# Patient Record
Sex: Male | Born: 1985 | Race: White | Hispanic: No | Marital: Married | Smoking: Never smoker
Health system: Southern US, Community
[De-identification: ages and names within clinical notes are randomized; demographics above are authoritative.]

## PROBLEM LIST (undated history)

## (undated) HISTORY — PX: NO PAST SURGERIES: SHX2092

---

## 2012-07-23 ENCOUNTER — Encounter (HOSPITAL_COMMUNITY): Payer: Self-pay | Admitting: *Deleted

## 2012-07-23 ENCOUNTER — Emergency Department (HOSPITAL_COMMUNITY)
Admission: EM | Admit: 2012-07-23 | Discharge: 2012-07-23 | Disposition: A | Discharge status: Home / Self Care | Payer: BC Managed Care – PPO | Attending: Emergency Medicine | Admitting: Emergency Medicine

## 2012-07-23 ENCOUNTER — Emergency Department (HOSPITAL_COMMUNITY): Payer: BC Managed Care – PPO

## 2012-07-23 DIAGNOSIS — S52123A Displaced fracture of head of unspecified radius, initial encounter for closed fracture: Secondary | ICD-10-CM | POA: Insufficient documentation

## 2012-07-23 MED ORDER — OXYCODONE-ACETAMINOPHEN 5-325 MG PO TABS: 2.0000 | ORAL_TABLET | ORAL | Status: AC | PRN

## 2012-07-23 MED ORDER — TETANUS-DIPHTH-ACELL PERTUSSIS 5-2.5-18.5 LF-MCG/0.5 IM SUSP
0.5000 mL | Freq: Once | INTRAMUSCULAR | Status: AC
  Administered 2012-07-23: 0.5 mL via INTRAMUSCULAR
  Filled 2012-07-23: qty 0.5

## 2012-07-23 MED ORDER — OXYCODONE-ACETAMINOPHEN 5-325 MG PO TABS
2.0000 | ORAL_TABLET | Freq: Once | ORAL | Status: AC
  Administered 2012-07-23: 2 via ORAL
  Filled 2012-07-23: qty 2
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
## 2012-07-23 NOTE — Progress Notes (Signed)
Orthopedic Tech Progress Note Patient Details:  William Duncan December 06, 1986 [id number]  Ortho Devices Type of Ortho Device: Arm foam sling;Long arm splint Ortho Device/Splint Location: (L) UE Ortho Device/Splint Interventions: Application   Jennye Moccasin 07/23/2012, 3:32 PM
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
## 2018-12-26 DIAGNOSIS — J209 Acute bronchitis, unspecified: Secondary | ICD-10-CM | POA: Diagnosis not present

## 2019-01-02 ENCOUNTER — Ambulatory Visit (INDEPENDENT_AMBULATORY_CARE_PROVIDER_SITE_OTHER): Payer: 59 | Admitting: Family Medicine

## 2019-01-02 ENCOUNTER — Encounter: Payer: Self-pay | Admitting: Family Medicine

## 2019-01-02 VITALS — BP 106/70 | HR 67 | Temp 97.4°F | Ht 64.5 in | Wt 177.8 lb

## 2019-01-02 DIAGNOSIS — Z0000 Encounter for general adult medical examination without abnormal findings: Secondary | ICD-10-CM | POA: Diagnosis not present

## 2019-01-02 NOTE — Patient Instructions (Addendum)
Bring your lab results from the [facility] physical   Preventive Care 18-39 Years, Male Preventive care refers to lifestyle choices and visits with your health care provider that can promote health and wellness. What does preventive care include?   A yearly physical exam. This is also called an annual well check.  Dental exams once or twice a year.  Routine eye exams. Ask your health care provider how often you should have your eyes checked.  Personal lifestyle choices, including: ? Daily care of your teeth and gums. ? Regular physical activity. ? Eating a healthy diet. ? Avoiding tobacco and drug use. ? Limiting alcohol use. ? Practicing safe sex. What happens during an annual well check? The services and screenings done by your health care provider during your annual well check will depend on your age, overall health, lifestyle risk factors, and family history of disease. Counseling Your health care provider may ask you questions about your:  Alcohol use.  Tobacco use.  Drug use.  Emotional well-being.  Home and relationship well-being.  Sexual activity.  Eating habits.  Work and work Statistician. Screening You may have the following tests or measurements:  Height, weight, and BMI.  Blood pressure.  Lipid and cholesterol levels. These may be checked every 5 years starting at age 2.  Diabetes screening. This is done by checking your blood sugar (glucose) after you have not eaten for a while (fasting).  Skin check.  Hepatitis C blood test.  Hepatitis B blood test.  Sexually transmitted disease (STD) testing. Discuss your test results, treatment options, and if necessary, the need for more tests with your health care provider. Vaccines Your health care provider may recommend certain vaccines, such as:  Influenza vaccine. This is recommended every year.  Tetanus, diphtheria, and acellular pertussis (Tdap, Td) vaccine. You may need a Td booster every  10 years.  Varicella vaccine. You may need this if you have not been vaccinated.  HPV vaccine. If you are 57 or younger, you may need three doses over 6 months.  Measles, mumps, and rubella (MMR) vaccine. You may need at least one dose of MMR.You may also need a second dose.  Pneumococcal 13-valent conjugate (PCV13) vaccine. You may need this if you have certain conditions and have not been vaccinated.  Pneumococcal polysaccharide (PPSV23) vaccine. You may need one or two doses if you smoke cigarettes or if you have certain conditions.  Meningococcal vaccine. One dose is recommended if you are age 52-21 years and a first-year college student living in a residence hall, or if you have one of several medical conditions. You may also need additional booster doses.  Hepatitis A vaccine. You may need this if you have certain conditions or if you travel or work in places where you may be exposed to hepatitis A.  Hepatitis B vaccine. You may need this if you have certain conditions or if you travel or work in places where you may be exposed to hepatitis B.  Haemophilus influenzae type b (Hib) vaccine. You may need this if you have certain risk factors. Talk to your health care provider about which screenings and vaccines you need and how often you need them. This information is not intended to replace advice given to you by your health care provider. Make sure you discuss any questions you have with your health care provider. Document Released: 01/31/2002 Document Revised: 07/18/2017 Document Reviewed: 10/06/2015 Elsevier Interactive Patient Education  2019 [employer].
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
## 2019-06-20 ENCOUNTER — Encounter: Payer: Self-pay | Admitting: Family Medicine

## 2019-06-20 ENCOUNTER — Ambulatory Visit (INDEPENDENT_AMBULATORY_CARE_PROVIDER_SITE_OTHER): Payer: 59 | Admitting: Family Medicine

## 2019-06-20 VITALS — HR 60 | Wt 175.0 lb

## 2019-06-20 DIAGNOSIS — K219 Gastro-esophageal reflux disease without esophagitis: Secondary | ICD-10-CM | POA: Diagnosis not present

## 2019-06-20 HISTORY — DX: Gastro-esophageal reflux disease without esophagitis: K21.9

## 2019-06-20 MED ORDER — PANTOPRAZOLE SODIUM 20 MG PO TBEC: 20.0000 mg | DELAYED_RELEASE_TABLET | Freq: Every day | ORAL | 1 refills | Status: DC

## 2019-06-20 NOTE — Patient Instructions (Signed)

## 2019-06-20 NOTE — Assessment & Plan Note (Signed)
Given symptoms most likely suspect GERD. Trial of PPI and will switch as questionable improvement on prilosec. Also discussed diet/lifestyle changes. If no improvement would do H pylori testing and consider GI referral if negative.

## 2019-06-20 NOTE — Progress Notes (Signed)
I connected with William Duncan on 06/20/19 at  2:00 PM EDT by video and verified that I am speaking with the correct person using two identifiers.   I discussed the limitations, risks, security and privacy concerns of performing an evaluation and management service by video and the availability of in person appointments. I also discussed with the patient that there may be a patient responsible charge related to this service. The patient expressed understanding and agreed to proceed.  Patient location: Work Provider Location: [facility] Participants: Lynnda ChildJessica R Kristinia Leavy and William Duncan   Subjective:     William Duncan is a 33 y.o. male presenting for Trouble eating (he is not able to eat as much as he is used-eating about a quarter of what he is use to. Has a gag like sensation, feels like he needs to vomit a lot. Has taking Tums. This started around November 2019.)     HPI   #Trouble eating - has been going on since November - will get a gag reflex  - was sick from November-December > respiratory symptoms followed by nausea - since then has not been able to eat as much as normal - no significant weight loss - certain foods make it worse - runs/work-out regularly - will eat most of his meal, but towards the end will start to get a gag reflex sensation - no abdominal pain - triggers: coffee - no vomiting - no difficulty swallowing or sensation of something stuck in the throat  - was primarily breakfast, but now progressing - grease in sausage seemed to trigger  -- Breakfast: Oatmeal (1 bag of instant) -- Lunch: frozen lasagna, leftovers  -- Dinner: chicken and pasta, steak, frozen   Endorses reflux symptoms at night  Treatment: tums, omeprazole intermittently, but trying to take daily for 1.5 weeks   Review of Systems See HPI  Social History   Tobacco Use  Smoking Status Never Smoker  Smokeless Tobacco Never Used        Objective:   BP Readings from  Last 3 Encounters:  01/02/19 106/70  07/23/12 142/68   Wt Readings from Last 3 Encounters:  06/20/19 175 lb (79.4 kg)  01/02/19 177 lb 12 oz (80.6 kg)    Pulse 60 Comment: per patient  Wt 175 lb (79.4 kg) Comment: per patient  BMI 29.57 kg/m    Physical Exam Constitutional:      Appearance: Normal appearance. He is not ill-appearing.  HENT:     Head: Normocephalic and atraumatic.     Right Ear: External ear normal.     Left Ear: External ear normal.  Eyes:     Conjunctiva/sclera: Conjunctivae normal.  Pulmonary:     Effort: Pulmonary effort is normal. No respiratory distress.  Neurological:     Mental Status: He is alert. Mental status is at baseline.  Psychiatric:        Mood and Affect: Mood normal.        Behavior: Behavior normal.        Thought Content: Thought content normal.        Judgment: Judgment normal.            Assessment & Plan:   Problem List Items Addressed This Visit      Digestive   Gastroesophageal reflux disease - Primary    Given symptoms most likely suspect GERD. Trial of PPI and will switch as questionable improvement on prilosec. Also discussed diet/lifestyle changes. If no improvement would  do H pylori testing and consider GI referral if negative.       Relevant Medications   pantoprazole (PROTONIX) 20 MG tablet       Return in about 4 weeks (around 07/18/2019), or if symptoms worsen or fail to improve.  Lesleigh Noe, MD

## 2019-08-31 ENCOUNTER — Other Ambulatory Visit: Payer: Self-pay | Admitting: Family Medicine

## 2019-08-31 DIAGNOSIS — K219 Gastro-esophageal reflux disease without esophagitis: Secondary | ICD-10-CM

## 2020-03-24 LAB — COMPREHENSIVE METABOLIC PANEL
Albumin: 5 (ref 3.5–5.0)
Calcium: 9.7 (ref 8.7–10.7)
Globulin: 2.3

## 2020-03-24 LAB — BASIC METABOLIC PANEL
BUN: 28 — AB (ref 4–21)
CO2: 21 (ref 13–22)
Chloride: 102 (ref 99–108)
Creatinine: 1.3 (ref 0.6–1.3)
Glucose: 88
Potassium: 4.4 (ref 3.4–5.3)
Sodium: 140 (ref 137–147)

## 2020-03-24 LAB — LIPID PANEL
Cholesterol: 206 — AB (ref 0–200)
HDL: 60 (ref 35–70)
LDL Cholesterol: 131
LDl/HDL Ratio: 2.2
Triglycerides: 83 (ref 40–160)

## 2020-03-24 LAB — CBC AND DIFFERENTIAL
HCT: 45 (ref 41–53)
Hemoglobin: 15.1 (ref 13.5–17.5)
Neutrophils Absolute: 2
Platelets: 293 (ref 150–399)
WBC: 5

## 2020-03-24 LAB — HEPATIC FUNCTION PANEL
ALT: 47 — AB (ref 10–40)
AST: 33 (ref 14–40)
Alkaline Phosphatase: 92 (ref 25–125)
Bilirubin, Total: 0.3

## 2020-03-24 LAB — TSH: TSH: 2.8 (ref 0.41–5.90)

## 2020-03-24 LAB — CBC: RBC: 5.07 (ref 3.87–5.11)

## 2020-07-09 ENCOUNTER — Encounter: Payer: Self-pay | Admitting: Family Medicine

## 2020-07-09 ENCOUNTER — Other Ambulatory Visit: Payer: Self-pay

## 2020-07-09 ENCOUNTER — Ambulatory Visit: Payer: 59 | Admitting: Family Medicine

## 2020-07-09 VITALS — BP 110/80 | HR 81 | Temp 98.1°F | Wt 166.5 lb

## 2020-07-09 DIAGNOSIS — R519 Headache, unspecified: Secondary | ICD-10-CM

## 2020-07-09 NOTE — Progress Notes (Signed)
Subjective:     William Duncan is a 34 y.o. male presenting for Headache (monday & tuesday ), Nausea (monday), and Emesis (tuesday)     HPI   #vomiting - 2 mornings in a row  - felt nauseous in the AM vomited and then felt better - also with a new HA which has occurred at the same time - no symptoms on Wednesday or Today - endorsed shaking after vomiting - hx of reflux but non currently  HA - left side/top of head - achy, pressure, throbbing - no vision changes - thought it might be allergies - took an allergy pill  - 2nd morning - sensitivity to light, not sound - headache worse throughout the day - but present in the AM - no weakness or tremors - water seemed to make it worse - has cut back on coffee over the last month     Review of Systems   Social History   Tobacco Use  Smoking Status Never Smoker  Smokeless Tobacco Never Used        Objective:    BP Readings from Last 3 Encounters:  07/09/20 110/80  01/02/19 106/70  07/23/12 142/68   Wt Readings from Last 3 Encounters:  07/09/20 166 lb 8 oz (75.5 kg)  06/20/19 175 lb (79.4 kg)  01/02/19 177 lb 12 oz (80.6 kg)    BP 110/80   Pulse 81   Temp 98.1 F (36.7 C) (Temporal)   Wt 166 lb 8 oz (75.5 kg)   SpO2 97%   BMI 28.14 kg/m    Physical Exam Constitutional:      Appearance: Normal appearance. He is well-developed and normal weight. He is not ill-appearing or diaphoretic.  HENT:     Head: Normocephalic and atraumatic.     Right Ear: External ear normal.     Left Ear: External ear normal.     Nose: Nose normal.     Mouth/Throat:     Mouth: Mucous membranes are moist.  Eyes:     General: No visual field deficit or scleral icterus.    Extraocular Movements: Extraocular movements intact.     Conjunctiva/sclera: Conjunctivae normal.     Pupils: Pupils are equal, round, and reactive to light.  Cardiovascular:     Rate and Rhythm: Normal rate and regular rhythm.     Heart sounds: No  murmur heard.   Pulmonary:     Effort: Pulmonary effort is normal. No respiratory distress.     Breath sounds: Normal breath sounds. No wheezing.  Abdominal:     General: Bowel sounds are normal. There is no distension.     Palpations: Abdomen is soft.     Tenderness: There is no abdominal tenderness. There is no guarding.  Musculoskeletal:     Cervical back: Normal range of motion and neck supple.  Skin:    General: Skin is warm and dry.  Neurological:     Mental Status: He is alert and oriented to person, place, and time. Mental status is at baseline.     Cranial Nerves: No cranial nerve deficit.     Sensory: No sensory deficit.     Motor: No weakness.     Coordination: Coordination normal.     Gait: Gait normal.  Psychiatric:        Mood and Affect: Mood normal.        Behavior: Behavior normal.           Assessment & Plan:  Problem List Items Addressed This Visit    None    Visit Diagnoses    Nonintractable headache, unspecified chronicity pattern, unspecified headache type    -  Primary     Reassuring exam and symptoms have resolved. Discussed that morning HA with n/v concerning for possible brain lesion and if symptoms return and persist would consider head imaging to rule out. At this point given resolution suspect a viral syndrome which has resolved.    Return if symptoms worsen or fail to improve.  Lynnda Child, MD  This visit occurred during the SARS-CoV-2 public health emergency.  Safety protocols were in place, including screening questions prior to the visit, additional usage of staff PPE, and extensive cleaning of exam room while observing appropriate contact time as indicated for disinfecting solutions.

## 2020-07-16 ENCOUNTER — Encounter: Payer: Self-pay | Admitting: Family Medicine

## 2021-12-21 ENCOUNTER — Ambulatory Visit: Payer: 59 | Admitting: Podiatry

## 2021-12-21 ENCOUNTER — Other Ambulatory Visit: Payer: Self-pay

## 2021-12-21 ENCOUNTER — Encounter: Payer: Self-pay | Admitting: Podiatry

## 2021-12-21 DIAGNOSIS — B070 Plantar wart: Secondary | ICD-10-CM | POA: Diagnosis not present

## 2021-12-21 NOTE — Progress Notes (Signed)
° °  Subjective: 36 y.o. male presenting today as a new patient for evaluation of a symptomatic skin lesion to the plantar aspect of the left foot x1 year.  Patient states that he has tried freezing the area off.  He believes it is a wart.  He presents for further treatment and evaluation   Past Medical History:  Diagnosis Date   Gastroesophageal reflux disease 06/20/2019   Past Surgical History:  Procedure Laterality Date   NO PAST SURGERIES     No Known Allergies  Objective: Physical Exam General: The patient is alert and oriented x3 in no acute distress.   Dermatology: Hyperkeratotic skin lesion(s) noted to the plantar aspect of the left foot approximately 1 cm in diameter. Pinpoint bleeding noted upon debridement. Skin is warm, dry and supple bilateral lower extremities. Negative for open lesions or macerations.   Vascular: Palpable pedal pulses bilaterally. No edema or erythema noted. Capillary refill within normal limits.   Neurological: Epicritic and protective threshold grossly intact bilaterally.    Musculoskeletal Exam: Pain on palpation to the noted skin lesion(s).  Range of motion within normal limits to all pedal and ankle joints bilateral. Muscle strength 5/5 in all groups bilateral.    Assessment: #1 plantar wart left foot   Plan of Care:  #1 Patient was evaluated. #2 Excisional debridement of the plantar wart lesion(s) was performed using a chisel blade.  Salicylic acid was applied and the lesion(s) was dressed with a dry sterile dressing. #3  Salicylic acid provided for the patient to apply daily x3 weeks  #4 patient is to return to clinic in 4 weeks  Felecia Shelling, DPM [facility]  Dr. Felecia Shelling, DPM    [address] [place], [place] [address]                Office [phone]  Fax [phone]

## 2022-01-18 ENCOUNTER — Encounter: Payer: Self-pay | Admitting: Podiatry

## 2022-01-18 ENCOUNTER — Other Ambulatory Visit: Payer: Self-pay

## 2022-01-18 ENCOUNTER — Ambulatory Visit: Payer: 59 | Admitting: Podiatry

## 2022-01-18 DIAGNOSIS — B070 Plantar wart: Secondary | ICD-10-CM | POA: Diagnosis not present

## 2022-01-18 NOTE — Progress Notes (Signed)
° °  Subjective: Patient presents today for follow-up evaluation of a plantar wart to the left lower extremity.  He says that he is feeling much better.  He has been applying the salicylic acid as instructed and he has noticed layers of skin sloughing off over the past few weeks.  Patient presents today for follow-up treatment and evaluation  Past Medical History:  Diagnosis Date   Gastroesophageal reflux disease 06/20/2019   Past Surgical History:  Procedure Laterality Date   NO PAST SURGERIES     No Known Allergies    Objective: Physical Exam General: The patient is alert and oriented x3 in no acute distress.   Dermatology: There continues to be a very small hyperkeratotic lesion to the plantar aspect of the left foot.  Skin is warm, dry and supple bilateral lower extremities. Negative for open lesions or macerations.   Vascular: Palpable pedal pulses bilaterally. No edema or erythema noted. Capillary refill within normal limits.   Neurological: Epicritic and protective threshold grossly intact bilaterally.    Musculoskeletal Exam: Negative for any significant pain on palpation to the noted skin lesion.  Range of motion within normal limits to all pedal and ankle joints bilateral. Muscle strength 5/5 in all groups bilateral.    Assessment: #1 plantar wart left foot-resolved #2 pain in left foot-resolved     Plan of Care:  #1 Patient was evaluated. #2 light excisional debridement of the plantar wart lesion was performed using a chisel blade.  Recommend continuing the salicylic acid for 1 additional week.  After that he may resume full activity with no restrictions and normal foot care #3 return to clinic prn     Felecia Shelling, DPM [facility]  Dr. Felecia Shelling, DPM    [address] [place], [place] [address]                Office [phone]  Fax [phone]

## 2022-10-15 ENCOUNTER — Emergency Department (HOSPITAL_COMMUNITY)
Admission: EM | Admit: 2022-10-15 | Discharge: 2022-10-16 | Disposition: A | Discharge status: Home / Self Care | Payer: No Typology Code available for payment source | Attending: Emergency Medicine | Admitting: Emergency Medicine

## 2022-10-15 ENCOUNTER — Emergency Department (HOSPITAL_COMMUNITY): Payer: No Typology Code available for payment source

## 2022-10-15 ENCOUNTER — Other Ambulatory Visit: Payer: Self-pay

## 2022-10-15 DIAGNOSIS — Y990 Civilian activity done for income or pay: Secondary | ICD-10-CM | POA: Diagnosis not present

## 2022-10-15 DIAGNOSIS — S20411A Abrasion of right back wall of thorax, initial encounter: Secondary | ICD-10-CM | POA: Diagnosis not present

## 2022-10-15 DIAGNOSIS — M546 Pain in thoracic spine: Secondary | ICD-10-CM

## 2022-10-15 DIAGNOSIS — S20412A Abrasion of left back wall of thorax, initial encounter: Secondary | ICD-10-CM | POA: Insufficient documentation

## 2022-10-15 DIAGNOSIS — W132XXA Fall from, out of or through roof, initial encounter: Secondary | ICD-10-CM | POA: Insufficient documentation

## 2022-10-15 DIAGNOSIS — T07XXXA Unspecified multiple injuries, initial encounter: Secondary | ICD-10-CM

## 2022-10-15 DIAGNOSIS — S29002A Unspecified injury of muscle and tendon of back wall of thorax, initial encounter: Secondary | ICD-10-CM | POA: Diagnosis present

## 2022-10-15 MED ORDER — OXYCODONE-ACETAMINOPHEN 5-325 MG PO TABS
1.0000 | ORAL_TABLET | Freq: Once | ORAL | Status: AC
  Administered 2022-10-15: 1 via ORAL
  Filled 2022-10-15: qty 1

## 2022-10-15 NOTE — ED Provider Triage Note (Signed)
Emergency Medicine Provider Triage Evaluation Note  William Duncan , a 36 y.o. male  was evaluated in triage.  Pt complains of back pain when a structured collapsed.  Patient was standing on the roof which suddenly collapsed felt about 12 feet in the air, reports landing directly on his back, has bilateral abrasions noted.  Patient was on the roof and landed on pellet underneath on his back. He was wearing his safety helmet. No LOC. .  Review of Systems  Positive: Back pain Negative: headache  Physical Exam  BP (!) 125/102 (BP Location: Right Arm)   Pulse 78   Temp 97.7 F (36.5 C) (Oral)   Resp 19   SpO2 97%  Gen:   Awake, no distress   Resp:  Normal effort  MSK:   Moves extremities without difficulty  Other:  Abrasions to both sides of his back  Medical Decision Making  Medically screening exam initiated at 7:12 PM.  Appropriate orders placed.  Phill Steck was informed that the remainder of the evaluation will be completed by another provider, this initial triage assessment does not replace that evaluation, and the importance of remaining in the ED until their evaluation is complete.  Not on a c collar, abrasions listed.    Janeece Fitting, PA-C 10/15/22 1913

## 2022-10-15 NOTE — ED Triage Notes (Signed)
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 Pt complains of back pain.

## 2022-10-16 ENCOUNTER — Emergency Department (HOSPITAL_COMMUNITY): Payer: No Typology Code available for payment source

## 2022-10-16 LAB — I-STAT CHEM 8, ED
BUN: 18 mg/dL (ref 6–20)
Calcium, Ion: 1.15 mmol/L (ref 1.15–1.40)
Chloride: 99 mmol/L (ref 98–111)
Creatinine, Ser: 1.1 mg/dL (ref 0.61–1.24)
Glucose, Bld: 90 mg/dL (ref 70–99)
HCT: 45 % (ref 39.0–52.0)
Hemoglobin: 15.3 g/dL (ref 13.0–17.0)
Potassium: 3.5 mmol/L (ref 3.5–5.1)
Sodium: 139 mmol/L (ref 135–145)
TCO2: 26 mmol/L (ref 22–32)

## 2022-10-16 LAB — RAPID URINE DRUG SCREEN, HOSP PERFORMED
Amphetamines: NOT DETECTED
Barbiturates: NOT DETECTED
Benzodiazepines: NOT DETECTED
Cocaine: NOT DETECTED
Opiates: NOT DETECTED
Tetrahydrocannabinol: NOT DETECTED

## 2022-10-16 MED ORDER — CYCLOBENZAPRINE HCL 10 MG PO TABS
10.0000 mg | ORAL_TABLET | Freq: Once | ORAL | Status: AC
  Administered 2022-10-16: 10 mg via ORAL
  Filled 2022-10-16: qty 1

## 2022-10-16 MED ORDER — IOHEXOL 350 MG/ML SOLN
75.0000 mL | Freq: Once | INTRAVENOUS | Status: AC | PRN
  Administered 2022-10-16: 75 mL via INTRAVENOUS

## 2022-10-16 MED ORDER — METHOCARBAMOL 500 MG PO TABS: 500.0000 mg | ORAL_TABLET | Freq: Two times a day (BID) | ORAL | 0 refills | Status: AC

## 2022-10-16 MED ORDER — NAPROXEN 250 MG PO TABS
500.0000 mg | ORAL_TABLET | Freq: Once | ORAL | Status: AC
  Administered 2022-10-16: 500 mg via ORAL
  Filled 2022-10-16: qty 2

## 2022-10-16 MED ORDER — DICLOFENAC SODIUM 50 MG PO TBEC: 50.0000 mg | DELAYED_RELEASE_TABLET | Freq: Two times a day (BID) | ORAL | 0 refills | Status: AC

## 2022-10-16 NOTE — Discharge Instructions (Signed)
Recheck with your Worker's Comp. provider in 2 days. Return to ER for severe or concerning symptoms.  Robaxin and diclofenac for back pain as needed as prescribed.  Do not drive or operate machinery if taking Robaxin

## 2022-10-16 NOTE — ED Provider Notes (Signed)
[facility]   CSN: [id number] Arrival date & time: 10/15/22  1854     History  Chief Complaint  Patient presents with   Back Pain    William AspenBrian Duncan is a 36 y.o. male.  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  He reports pain in his back.  Patient was wearing a helmet at the time, no loss of consciousness, has been ambulatory since the incident without difficulty.  No other injuries, complaints, concerns.       Home Medications Prior to Admission medications   Medication Sig Start Date End Date Taking? Authorizing Provider  diclofenac (VOLTAREN) 50 MG EC tablet Take 1 tablet (50 mg total) by mouth 2 (two) times daily. 10/16/22  Yes Jeannie FendMurphy, Tameya Kuznia A, PA-C  methocarbamol (ROBAXIN) 500 MG tablet Take 1 tablet (500 mg total) by mouth 2 (two) times daily. 10/16/22  Yes Jeannie FendMurphy, Ashaki Frosch A, PA-C  Multiple Vitamins-Minerals (MULTIVITAMIN WITH MINERALS) tablet Take 1 tablet by mouth daily.    [provider]      Allergies    Patient has no known allergies.    Review of Systems   Review of Systems Negative except as per HPI Physical Exam Updated Vital Signs BP 116/77   Pulse 62   Temp 97.8 F (36.6 C) (Oral)   Resp 18   SpO2 97%  Physical Exam Vitals and nursing note reviewed.  Constitutional:      General: He is not in acute distress.    Appearance: He is well-developed. He is not diaphoretic.  HENT:     Head: Normocephalic and atraumatic.  Eyes:     Conjunctiva/sclera: Conjunctivae normal.  Cardiovascular:     Pulses: Normal pulses.  Pulmonary:     Effort: Pulmonary effort is normal.  Abdominal:     Palpations: Abdomen is soft.     Tenderness: There is no abdominal tenderness.   Musculoskeletal:        General: Tenderness present. Normal range of motion.     Cervical back: Normal range of motion and neck supple. No tenderness or bony tenderness.     Thoracic back: Tenderness and bony tenderness present.     Lumbar back: No tenderness or bony tenderness.       Back:  Skin:    General: Skin is warm and dry.     Findings: No erythema or rash.  Neurological:     Mental Status: He is alert and oriented to person, place, and time.     Sensory: No sensory deficit.     Motor: No weakness.     Gait: Gait normal.  Psychiatric:        Behavior: Behavior normal.     ED Results / Procedures / Treatments   Labs (all labs ordered are listed, but only abnormal results are displayed) Labs Reviewed  RAPID URINE DRUG SCREEN, HOSP PERFORMED  I-STAT CHEM 8, ED    EKG None  Radiology CT CHEST ABDOMEN PELVIS W CONTRAST  Result Date: 10/16/2022 CLINICAL DATA:  Polytrauma, blunt; Neck trauma, uncomplicated (NEXUS/CCR neg) (Age 34-64y); Head trauma, minor, normal mental status (Age 34-64y). Fall, roof collapse. EXAM: CT HEAD WITHOUT CONTRAST CT CERVICAL SPINE WITHOUT CONTRAST CT CHEST, ABDOMEN AND PELVIS WITH  CONTRAST TECHNIQUE: Contiguous axial images were obtained from the base of the skull through the vertex without intravenous contrast. Multidetector CT imaging of the cervical spine was performed without intravenous contrast. Multiplanar CT image reconstructions were also generated. Multidetector CT imaging of the chest, abdomen and pelvis was performed following the standard protocol during bolus administration of intravenous contrast. RADIATION DOSE REDUCTION: This exam was performed according to the departmental dose-optimization program which includes automated exposure control, adjustment of the mA and/or kV according to patient size and/or use of iterative reconstruction technique. CONTRAST:  63mL OMNIPAQUE IOHEXOL 350 MG/ML SOLN COMPARISON:  None Available. FINDINGS:  CT HEAD FINDINGS Brain: Normal anatomic configuration. No abnormal intra or extra-axial mass lesion or fluid collection. No abnormal mass effect or midline shift. No evidence of acute intracranial hemorrhage or infarct. Ventricular size is normal. Cerebellum unremarkable. Vascular: Unremarkable Skull: Intact Sinuses/Orbits: Paranasal sinuses are clear. Orbits are unremarkable. Other: Mastoid air cells and middle ear cavities are clear. CT CERVICAL FINDINGS Alignment: Normal. Skull base and vertebrae: No acute fracture. No primary bone lesion or focal pathologic process. Soft tissues and spinal canal: No prevertebral fluid or swelling. No visible canal hematoma. Disc levels: Intervertebral disc heights are preserved. Prevertebral soft tissues are not thickened. Spinal canal is widely patent. No significant neuroforaminal narrowing. Other:  None CT CHEST FINDINGS Cardiovascular: No significant vascular findings. Normal heart size. No pericardial effusion. Mediastinum/Nodes: No enlarged mediastinal, hilar, or axillary lymph nodes. Thyroid gland, trachea, and esophagus demonstrate no significant findings. Lungs/Pleura: Lungs are clear. No pleural effusion or pneumothorax. Musculoskeletal: No acute bone abnormality. CT ABDOMEN PELVIS FINDINGS Hepatobiliary: No focal liver abnormality is seen. No gallstones, gallbladder wall thickening, or biliary dilatation. Pancreas: Unremarkable Spleen: Unremarkable Adrenals/Urinary Tract: Adrenal glands are unremarkable. Kidneys are normal, without renal calculi, focal lesion, or hydronephrosis. Bladder is unremarkable. Stomach/Bowel: Stomach is within normal limits. Appendix appears normal. No evidence of bowel wall thickening, distention, or inflammatory changes. Vascular/Lymphatic: No significant vascular findings are present. No enlarged abdominal or pelvic lymph nodes. Reproductive: Prostate is unremarkable. Other: No abdominal wall hernia or abnormality. No abdominopelvic  ascites. Musculoskeletal: No acute bone abnormality. IMPRESSION: 1. No acute intracranial injury. No calvarial fracture. 2. No acute fracture or listhesis of the cervical spine. 3. No acute intrathoracic or intra-abdominal injury. Electronically Signed   By: Helyn Numbers M.D.   On: 10/16/2022 03:24   CT HEAD WO CONTRAST ( )  Result Date: 10/16/2022 CLINICAL DATA:  Polytrauma, blunt; Neck trauma, uncomplicated (NEXUS/CCR neg) (Age 35-64y); Head trauma, minor, normal mental status (Age 55-64y). Fall, roof collapse. EXAM: CT HEAD WITHOUT CONTRAST CT CERVICAL SPINE WITHOUT CONTRAST CT CHEST, ABDOMEN AND PELVIS WITH CONTRAST TECHNIQUE: Contiguous axial images were obtained from the base of the skull through the vertex without intravenous contrast. Multidetector CT imaging of the cervical spine was performed without intravenous contrast. Multiplanar CT image reconstructions were also generated. Multidetector CT imaging of the chest, abdomen and pelvis was performed following the standard protocol during bolus administration of intravenous contrast. RADIATION DOSE REDUCTION: This exam was performed according to the departmental dose-optimization program which includes automated exposure control, adjustment of the mA and/or kV according to patient size and/or use of iterative reconstruction technique. CONTRAST:  66mL OMNIPAQUE IOHEXOL 350 MG/ML SOLN COMPARISON:  None Available. FINDINGS: CT HEAD FINDINGS Brain: Normal anatomic configuration. No abnormal intra or extra-axial mass lesion or fluid collection. No abnormal mass effect or midline shift. No evidence of acute intracranial hemorrhage or infarct. Ventricular size is normal. Cerebellum  unremarkable. Vascular: Unremarkable Skull: Intact Sinuses/Orbits: Paranasal sinuses are clear. Orbits are unremarkable. Other: Mastoid air cells and middle ear cavities are clear. CT CERVICAL FINDINGS Alignment: Normal. Skull base and vertebrae: No acute fracture. No primary  bone lesion or focal pathologic process. Soft tissues and spinal canal: No prevertebral fluid or swelling. No visible canal hematoma. Disc levels: Intervertebral disc heights are preserved. Prevertebral soft tissues are not thickened. Spinal canal is widely patent. No significant neuroforaminal narrowing. Other:  None CT CHEST FINDINGS Cardiovascular: No significant vascular findings. Normal heart size. No pericardial effusion. Mediastinum/Nodes: No enlarged mediastinal, hilar, or axillary lymph nodes. Thyroid gland, trachea, and esophagus demonstrate no significant findings. Lungs/Pleura: Lungs are clear. No pleural effusion or pneumothorax. Musculoskeletal: No acute bone abnormality. CT ABDOMEN PELVIS FINDINGS Hepatobiliary: No focal liver abnormality is seen. No gallstones, gallbladder wall thickening, or biliary dilatation. Pancreas: Unremarkable Spleen: Unremarkable Adrenals/Urinary Tract: Adrenal glands are unremarkable. Kidneys are normal, without renal calculi, focal lesion, or hydronephrosis. Bladder is unremarkable. Stomach/Bowel: Stomach is within normal limits. Appendix appears normal. No evidence of bowel wall thickening, distention, or inflammatory changes. Vascular/Lymphatic: No significant vascular findings are present. No enlarged abdominal or pelvic lymph nodes. Reproductive: Prostate is unremarkable. Other: No abdominal wall hernia or abnormality. No abdominopelvic ascites. Musculoskeletal: No acute bone abnormality. IMPRESSION: 1. No acute intracranial injury. No calvarial fracture. 2. No acute fracture or listhesis of the cervical spine. 3. No acute intrathoracic or intra-abdominal injury. Electronically Signed   By: Helyn Numbers M.D.   On: 10/16/2022 03:24   CT Cervical Spine Wo Contrast  Result Date: 10/16/2022 CLINICAL DATA:  Polytrauma, blunt; Neck trauma, uncomplicated (NEXUS/CCR neg) (Age 68-64y); Head trauma, minor, normal mental status (Age 40-64y). Fall, roof collapse. EXAM: CT  HEAD WITHOUT CONTRAST CT CERVICAL SPINE WITHOUT CONTRAST CT CHEST, ABDOMEN AND PELVIS WITH CONTRAST TECHNIQUE: Contiguous axial images were obtained from the base of the skull through the vertex without intravenous contrast. Multidetector CT imaging of the cervical spine was performed without intravenous contrast. Multiplanar CT image reconstructions were also generated. Multidetector CT imaging of the chest, abdomen and pelvis was performed following the standard protocol during bolus administration of intravenous contrast. RADIATION DOSE REDUCTION: This exam was performed according to the departmental dose-optimization program which includes automated exposure control, adjustment of the mA and/or kV according to patient size and/or use of iterative reconstruction technique. CONTRAST:  83mL OMNIPAQUE IOHEXOL 350 MG/ML SOLN COMPARISON:  None Available. FINDINGS: CT HEAD FINDINGS Brain: Normal anatomic configuration. No abnormal intra or extra-axial mass lesion or fluid collection. No abnormal mass effect or midline shift. No evidence of acute intracranial hemorrhage or infarct. Ventricular size is normal. Cerebellum unremarkable. Vascular: Unremarkable Skull: Intact Sinuses/Orbits: Paranasal sinuses are clear. Orbits are unremarkable. Other: Mastoid air cells and middle ear cavities are clear. CT CERVICAL FINDINGS Alignment: Normal. Skull base and vertebrae: No acute fracture. No primary bone lesion or focal pathologic process. Soft tissues and spinal canal: No prevertebral fluid or swelling. No visible canal hematoma. Disc levels: Intervertebral disc heights are preserved. Prevertebral soft tissues are not thickened. Spinal canal is widely patent. No significant neuroforaminal narrowing. Other:  None CT CHEST FINDINGS Cardiovascular: No significant vascular findings. Normal heart size. No pericardial effusion. Mediastinum/Nodes: No enlarged mediastinal, hilar, or axillary lymph nodes. Thyroid gland, trachea, and  esophagus demonstrate no significant findings. Lungs/Pleura: Lungs are clear. No pleural effusion or pneumothorax. Musculoskeletal: No acute bone abnormality. CT ABDOMEN PELVIS FINDINGS Hepatobiliary: No focal liver abnormality is seen. No gallstones, gallbladder  wall thickening, or biliary dilatation. Pancreas: Unremarkable Spleen: Unremarkable Adrenals/Urinary Tract: Adrenal glands are unremarkable. Kidneys are normal, without renal calculi, focal lesion, or hydronephrosis. Bladder is unremarkable. Stomach/Bowel: Stomach is within normal limits. Appendix appears normal. No evidence of bowel wall thickening, distention, or inflammatory changes. Vascular/Lymphatic: No significant vascular findings are present. No enlarged abdominal or pelvic lymph nodes. Reproductive: Prostate is unremarkable. Other: No abdominal wall hernia or abnormality. No abdominopelvic ascites. Musculoskeletal: No acute bone abnormality. IMPRESSION: 1. No acute intracranial injury. No calvarial fracture. 2. No acute fracture or listhesis of the cervical spine. 3. No acute intrathoracic or intra-abdominal injury. Electronically Signed   By: Helyn Numbers M.D.   On: 10/16/2022 03:24   CT L-SPINE NO CHARGE  Result Date: 10/16/2022 CLINICAL DATA:  Trauma, fall 12 feet, landed on back EXAM: CT THORACIC AND LUMBAR SPINE WITHOUT CONTRAST TECHNIQUE: Multidetector CT imaging of the thoracic and lumbar spine was performed without contrast. Multiplanar CT image reconstructions were also generated. RADIATION DOSE REDUCTION: This exam was performed according to the departmental dose-optimization program which includes automated exposure control, adjustment of the mA and/or kV according to patient size and/or use of iterative reconstruction technique. COMPARISON:  None Available. FINDINGS: CT THORACIC SPINE FINDINGS Alignment: No listhesis. Mild S shaped curvature of the thoracolumbar spine. Preservation of the normal thoracic kyphosis. Vertebrae: No  acute fracture or focal pathologic process. Paraspinal and other soft tissues: Please see same-day CT chest abdomen pelvis. Disc levels: No significant spinal canal stenosis or neural foraminal narrowing. CT LUMBAR SPINE FINDINGS Segmentation: 5 lumbar-type vertebral bodies. Alignment: Mild S shaped curvature of the thoracolumbar spine. No listhesis. Preservation of the normal lumbar lordosis. Vertebrae: No acute fracture or focal pathologic process. Paraspinal and other soft tissues: Please see same-day CT chest abdomen pelvis. Disc levels: No significant spinal canal stenosis or neural foraminal narrowing. IMPRESSION: 1. No acute fracture or traumatic listhesis in the thoracic or lumbar spine. 2. For soft tissue findings, please see same-day CT chest abdomen pelvis. Electronically Signed   By: Wiliam Ke M.D.   On: 10/16/2022 03:16   CT T-SPINE NO CHARGE  Result Date: 10/16/2022 CLINICAL DATA:  Trauma, fall 12 feet, landed on back EXAM: CT THORACIC AND LUMBAR SPINE WITHOUT CONTRAST TECHNIQUE: Multidetector CT imaging of the thoracic and lumbar spine was performed without contrast. Multiplanar CT image reconstructions were also generated. RADIATION DOSE REDUCTION: This exam was performed according to the departmental dose-optimization program which includes automated exposure control, adjustment of the mA and/or kV according to patient size and/or use of iterative reconstruction technique. COMPARISON:  None Available. FINDINGS: CT THORACIC SPINE FINDINGS Alignment: No listhesis. Mild S shaped curvature of the thoracolumbar spine. Preservation of the normal thoracic kyphosis. Vertebrae: No acute fracture or focal pathologic process. Paraspinal and other soft tissues: Please see same-day CT chest abdomen pelvis. Disc levels: No significant spinal canal stenosis or neural foraminal narrowing. CT LUMBAR SPINE FINDINGS Segmentation: 5 lumbar-type vertebral bodies. Alignment: Mild S shaped curvature of the  thoracolumbar spine. No listhesis. Preservation of the normal lumbar lordosis. Vertebrae: No acute fracture or focal pathologic process. Paraspinal and other soft tissues: Please see same-day CT chest abdomen pelvis. Disc levels: No significant spinal canal stenosis or neural foraminal narrowing. IMPRESSION: 1. No acute fracture or traumatic listhesis in the thoracic or lumbar spine. 2. For soft tissue findings, please see same-day CT chest abdomen pelvis. Electronically Signed   By: Wiliam Ke M.D.   On: 10/16/2022 03:16   DG Chest  2 View  Result Date: 10/15/2022 CLINICAL DATA:  Fall from roof.  Back pain. EXAM: CHEST - 2 VIEW COMPARISON:  None Available. FINDINGS: The lungs are clear without focal pneumonia, edema, pneumothorax or pleural effusion. The cardiopericardial silhouette is within normal limits for size. The visualized bony structures of the thorax are unremarkable. IMPRESSION: No active cardiopulmonary disease. No discernible thoracic spine fracture on this chest x-ray. Dedicated thoracic x-ray exam or CT could be used to further evaluate as clinically warranted. Electronically Signed   By: Misty Stanley M.D.   On: 10/15/2022 19:48    Procedures Procedures    Medications Ordered in ED Medications  oxyCODONE-acetaminophen (PERCOCET/ROXICET) 5-325 MG per tablet 1 tablet (1 tablet Oral Given 10/15/22 2342)  iohexol (OMNIPAQUE) 350 MG/ML injection 75 mL (75 mLs Intravenous Contrast Given 10/16/22 0248)  cyclobenzaprine (FLEXERIL) tablet 10 mg (10 mg Oral Given 10/16/22 1031)  naproxen (NAPROSYN) tablet 500 mg (500 mg Oral Given 10/16/22 1031)    ED Course/ Medical Decision Making/ A&P                           Medical Decision Making  This patient presents to the ED for concern of back pain after a fall as detailed above, this involves an extensive number of treatment options, and is a complaint that carries with it a high risk of complications and morbidity.  The differential  diagnosis includes but not limited to intra-abdominal injury, intrathoracic injury, spinal cord injury, spinal fracture, intracranial injury   Co morbidities that complicate the patient evaluation  Otherwise healthy   Additional history obtained:  Additional history obtained from significant other at bedside who contributes to history as above External records from outside source obtained and reviewed including prior labs on file for comparison   Lab Tests:  I Ordered, and personally interpreted labs.  The pertinent results include: I-STAT Chem-8 with normal renal function, normal H&H   Imaging Studies ordered:  I ordered imaging studies including CT head, C-spine, T-spine, L-spine, chest/abdomen/pelvis I independently visualized and interpreted imaging which showed no acute findings I agree with the radiologist interpretation   Problem List / ED Course / Critical interventions / Medication management  36 year old male presents for evaluation after fall from roof as detailed above.  He is found to have minor abrasions to his left and right back with midline thoracic tenderness.  His abdomen is soft and nontender, no flank ecchymosis.  H&H normal, vitals stable.  Extensive CT imaging completed and unrevealing.  He is found to have midline tenderness through his mid T-spine area, imaging of this area reveals no acute bony abnormality.  Patient is felt to be stable for discharge to follow-up with Worker's Comp. provider.  He is provided with Robaxin and diclofenac, advised not to drive while taking Robaxin. I ordered medication including naproxen and Flexeril for pain Reevaluation of the patient after these medicines showed that the patient improved I have reviewed the patients home medicines and have made adjustments as needed   Social Determinants of Health:  Lives with family, follow up with [facility] provider   Test / Admission - Considered:  After thorough evaluation the emergency  room without significant findings and stable vital signs, patient is considered safe for discharge to follow-up with his Worker's Comp. provider.         Final Clinical Impression(s) / ED Diagnoses Final diagnoses:  Fall from roof, initial encounter  Abrasions of multiple sites  Acute midline thoracic back pain    Rx / DC Orders ED Discharge Orders          Ordered    methocarbamol (ROBAXIN) 500 MG tablet  2 times daily        10/16/22 1010    diclofenac (VOLTAREN) 50 MG EC tablet  2 times daily        10/16/22 1010              Alden Hipp 10/16/22 1125    Mardene Sayer, MD 10/16/22 [phone]

## 2023-04-27 ENCOUNTER — Encounter: Payer: Self-pay | Admitting: Podiatry

## 2023-04-27 ENCOUNTER — Ambulatory Visit: Payer: 59 | Admitting: Podiatry

## 2023-04-27 VITALS — BP 118/65

## 2023-04-27 DIAGNOSIS — L989 Disorder of the skin and subcutaneous tissue, unspecified: Secondary | ICD-10-CM | POA: Diagnosis not present

## 2023-04-27 NOTE — Progress Notes (Signed)
  Subjective:  Patient ID: William Duncan, male    DOB: 03/11/1986,  MRN: [id number]  Chief Complaint  Patient presents with   Plantar Warts    "I'm here about the wart on the left pinky toenail."    37 y.o. male presents with the above complaint.  Patient presents with left submetatarsal 5 plantar verruca/benign skin lesion.  Patient states it came back.  Patient known to Dr. Logan Bores who is treating it.  He states he started hurting again denies any other acute complaints would like to discuss treatment options again.   Review of Systems: Negative except as noted in the HPI. Denies N/V/F/Ch.  Past Medical History:  Diagnosis Date   Gastroesophageal reflux disease 06/20/2019    Current Outpatient Medications:    diclofenac (VOLTAREN) 50 MG EC tablet, Take 1 tablet (50 mg total) by mouth 2 (two) times daily., Disp: 20 tablet, Rfl: 0   methocarbamol (ROBAXIN) 500 MG tablet, Take 1 tablet (500 mg total) by mouth 2 (two) times daily., Disp: 20 tablet, Rfl: 0   Multiple Vitamins-Minerals (MULTIVITAMIN WITH MINERALS) tablet, Take 1 tablet by mouth daily., Disp: , Rfl:   Social History   Tobacco Use  Smoking Status Never  Smokeless Tobacco Never    No Known Allergies Objective:   Vitals:   04/27/23 0845  BP: 118/65   There is no height or weight on file to calculate BMI. Constitutional Well developed. Well nourished.  Vascular Dorsalis pedis pulses palpable bilaterally. Posterior tibial pulses palpable bilaterally. Capillary refill normal to all digits.  No cyanosis or clubbing noted. Pedal hair growth normal.  Neurologic Normal speech. Oriented to person, place, and time. Epicritic sensation to light touch grossly present bilaterally.  Dermatologic Hyperkeratotic lesion with central nucleated core noted to left submetatarsal 5.  Mild pain on palpation  Orthopedic: Normal joint ROM without pain or crepitus bilaterally. No visible deformities. No bony tenderness.   Radiographs:  None Assessment:   1. Benign skin lesion    Plan:  Patient was evaluated and treated and all questions answered.  Left submetatarsal 5 benign skin lesion/porokeratosis -All questions and concerns were discussed with the patient in extensive detail.  Given that given the patient had detailed excisional debridement of the lesion I believe patient would benefit from [facility] therapy for with destruction of the lesion. -Cancer was applied in standard technique  No follow-ups on file.

## 2023-05-16 ENCOUNTER — Ambulatory Visit: Payer: 59 | Admitting: Podiatry

## 2023-05-16 DIAGNOSIS — B070 Plantar wart: Secondary | ICD-10-CM

## 2023-05-16 DIAGNOSIS — L989 Disorder of the skin and subcutaneous tissue, unspecified: Secondary | ICD-10-CM

## 2023-05-16 NOTE — Progress Notes (Signed)
  Subjective:  Patient ID: William Duncan, male    DOB: 23-Dec-1985,  MRN: [id number]  Chief Complaint  Patient presents with   Plantar Warts    37 y.o. male presents with the above complaint.  Patient presents with left submetatarsal 5 plantar verruca/benign skin lesion.  Patient states is doing a lot better.  Denies any other acute complaints.   Review of Systems: Negative except as noted in the HPI. Denies N/V/F/Ch.  Past Medical History:  Diagnosis Date   Gastroesophageal reflux disease 06/20/2019    Current Outpatient Medications:    diclofenac (VOLTAREN) 50 MG EC tablet, Take 1 tablet (50 mg total) by mouth 2 (two) times daily., Disp: 20 tablet, Rfl: 0   methocarbamol (ROBAXIN) 500 MG tablet, Take 1 tablet (500 mg total) by mouth 2 (two) times daily., Disp: 20 tablet, Rfl: 0   Multiple Vitamins-Minerals (MULTIVITAMIN WITH MINERALS) tablet, Take 1 tablet by mouth daily., Disp: , Rfl:   Social History   Tobacco Use  Smoking Status Never  Smokeless Tobacco Never    No Known Allergies Objective:   There were no vitals filed for this visit.  There is no height or weight on file to calculate BMI. Constitutional Well developed. Well nourished.  Vascular Dorsalis pedis pulses palpable bilaterally. Posterior tibial pulses palpable bilaterally. Capillary refill normal to all digits.  No cyanosis or clubbing noted. Pedal hair growth normal.  Neurologic Normal speech. Oriented to person, place, and time. Epicritic sensation to light touch grossly present bilaterally.  Dermatologic No further hyperkeratotic lesion with central nucleated core noted to left submetatarsal 5.  Mild pain on palpation  Orthopedic: Normal joint ROM without pain or crepitus bilaterally. No visible deformities. No bony tenderness.   Radiographs: None Assessment:   1. Benign skin lesion   2. Plantar wart, left foot     Plan:  Patient was evaluated and treated and all questions answered.  Left  submetatarsal 5 benign skin lesion/porokeratosis -Clinically healed and officially discharged from my care if any foot and ankle issues or in the future he will come back and see me.  I discussed prevention techniques.  No follow-ups on file.

## 2024-01-17 ENCOUNTER — Ambulatory Visit
Admission: EM | Admit: 2024-01-17 | Discharge: 2024-01-17 | Disposition: A | Discharge status: Home / Self Care | Payer: 59 | Attending: Emergency Medicine | Admitting: Emergency Medicine

## 2024-01-17 DIAGNOSIS — J101 Influenza due to other identified influenza virus with other respiratory manifestations: Secondary | ICD-10-CM | POA: Diagnosis not present

## 2024-01-17 LAB — POC COVID19/FLU A&B COMBO
Covid Antigen, POC: NEGATIVE
Influenza A Antigen, POC: POSITIVE — AB
Influenza B Antigen, POC: NEGATIVE

## 2024-01-17 LAB — POCT RAPID STREP A (OFFICE): Rapid Strep A Screen: NEGATIVE

## 2024-01-17 MED ORDER — OSELTAMIVIR PHOSPHATE 75 MG PO CAPS: 75.0000 mg | ORAL_CAPSULE | Freq: Two times a day (BID) | ORAL | 0 refills | Status: AC

## 2024-01-17 NOTE — Discharge Instructions (Addendum)
Take the Tamiflu as directed.  Follow-up with your primary care provider if your symptoms are not improving.

## 2024-01-17 NOTE — ED Triage Notes (Signed)
Patient to Urgent Care with complaints of fevers/ cough/ fatigue/ chills/ mucus.  Symptoms started yesterday. Took a flu/ Covid test yesterday that was negative.   Taking tylenol.

## 2024-01-17 NOTE — ED Provider Notes (Signed)
William Duncan    CSN: [id number] Arrival date & time: 01/17/24  1121      History   Chief Complaint Chief Complaint  Patient presents with   Fever    HPI Jereld Presti is a 38 y.o. male.  Presents with 1 day history of fever, chills, fatigue, runny nose, cough.  No shortness of breath, vomiting, diarrhea.  No OTC medication taken today; took Tylenol yesterday.  Negative flu and COVID test yesterday.  The history is provided by the patient and medical records.    Past Medical History:  Diagnosis Date   Gastroesophageal reflux disease 06/20/2019    There are no active problems to display for this patient.   Past Surgical History:  Procedure Laterality Date   NO PAST SURGERIES         Home Medications    Prior to Admission medications   Medication Sig Start Date End Date Taking? Authorizing Provider  oseltamivir (TAMIFLU) 75 MG capsule Take 1 capsule (75 mg total) by mouth every 12 (twelve) hours. 01/17/24  Yes Mickie Bail, NP  diclofenac (VOLTAREN) 50 MG EC tablet Take 1 tablet (50 mg total) by mouth 2 (two) times daily. Patient not taking: Reported on 01/17/2024 10/16/22   Army Melia A, PA-C  methocarbamol (ROBAXIN) 500 MG tablet Take 1 tablet (500 mg total) by mouth 2 (two) times daily. Patient not taking: Reported on 01/17/2024 10/16/22   Army Melia A, PA-C  Multiple Vitamins-Minerals (MULTIVITAMIN WITH MINERALS) tablet Take 1 tablet by mouth daily.    [provider]    Family History Family History  Problem Relation Age of Onset   Multiple sclerosis Paternal Aunt    Kidney disease Paternal Grandmother    Diabetes Paternal Grandmother    Multiple sclerosis Paternal Aunt     Social History Social History   Tobacco Use   Smoking status: Never   Smokeless tobacco: Never  Vaping Use   Vaping status: Never Used  Substance Use Topics   Alcohol use: Not Currently    Comment: once a week, 3-4 drinks   Drug use: No     Allergies    Patient has no known allergies.   Review of Systems Review of Systems  Constitutional:  Positive for chills and fever.  HENT:  Positive for rhinorrhea. Negative for ear pain and sore throat.   Respiratory:  Positive for cough. Negative for shortness of breath.   Gastrointestinal:  Negative for diarrhea and vomiting.     Physical Exam Triage Vital Signs ED Triage Vitals  Encounter Vitals Group     BP 01/17/24 1259 134/76     Systolic BP Percentile --      Diastolic BP Percentile --      Pulse Rate 01/17/24 1259 97     Resp 01/17/24 1259 18     Temp 01/17/24 1259 98.9 F (37.2 C)     Temp src --      SpO2 01/17/24 1259 95 %     Weight --      Height --      Head Circumference --      Peak Flow --      Pain Score 01/17/24 1308 4     Pain Loc --      Pain Education --      Exclude from Growth Chart --    No data found.  Updated Vital Signs BP 134/76   Pulse 97   Temp 98.9 F (37.2 C)  Resp 18   SpO2 95%   Visual Acuity Right Eye Distance:   Left Eye Distance:   Bilateral Distance:    Right Eye Near:   Left Eye Near:    Bilateral Near:     Physical Exam Constitutional:      General: He is not in acute distress. HENT:     Right Ear: Tympanic membrane normal.     Left Ear: Tympanic membrane normal.     Nose: Nose normal.     Mouth/Throat:     Mouth: Mucous membranes are moist.     Pharynx: Oropharynx is clear.  Cardiovascular:     Rate and Rhythm: Normal rate and regular rhythm.     Heart sounds: Normal heart sounds.  Pulmonary:     Effort: Pulmonary effort is normal. No respiratory distress.     Breath sounds: Normal breath sounds.  Neurological:     Mental Status: He is alert.      UC Treatments / Results  Labs (all labs ordered are listed, but only abnormal results are displayed) Labs Reviewed  POC COVID19/FLU A&B COMBO - Abnormal; Notable for the following components:      Result Value   Influenza A Antigen, POC Positive (*)    All  other components within normal limits  POCT RAPID STREP A (OFFICE)    EKG   Radiology No results found.  Procedures Procedures (including critical care time)  Medications Ordered in UC Medications - No data to display  Initial Impression / Assessment and Plan / UC Course  I have reviewed the triage vital signs and the nursing notes.  Pertinent labs & imaging results that were available during my care of the patient were reviewed by me and considered in my medical decision making (see chart for details).    Influenza A.  Rapid flu test positive for influenza A.  COVID negative.  Treating with Tamiflu.  Discussed symptomatic treatment including Tylenol or ibuprofen as needed, rest, hydration.  Education provided on influenza.  Instructed patient to follow-up with PCP if symptoms are not improving.  Patient agrees to plan of care.   Final Clinical Impressions(s) / UC Diagnoses   Final diagnoses:  Influenza A     Discharge Instructions      Take the Tamiflu as directed.  Follow-up with your primary care provider if your symptoms are not improving.      ED Prescriptions     Medication Sig Dispense Auth. Provider   oseltamivir (TAMIFLU) 75 MG capsule Take 1 capsule (75 mg total) by mouth every 12 (twelve) hours. 10 capsule Mickie Bail, NP      PDMP not reviewed this encounter.   Mickie Bail, NP 01/17/24 1335
